# Patient Record
Sex: Male | Born: 1980 | Race: Asian | Hispanic: No | Marital: Married | Smoking: Never smoker
Health system: Southern US, Community
[De-identification: ages and names within clinical notes are randomized; demographics above are authoritative.]

## PROBLEM LIST (undated history)

## (undated) DIAGNOSIS — E785 Hyperlipidemia, unspecified: Secondary | ICD-10-CM

## (undated) DIAGNOSIS — F329 Major depressive disorder, single episode, unspecified: Secondary | ICD-10-CM

## (undated) DIAGNOSIS — F32A Depression, unspecified: Secondary | ICD-10-CM

## (undated) DIAGNOSIS — F419 Anxiety disorder, unspecified: Secondary | ICD-10-CM

## (undated) HISTORY — DX: Major depressive disorder, single episode, unspecified: F32.9

## (undated) HISTORY — DX: Hyperlipidemia, unspecified: E78.5

## (undated) HISTORY — DX: Depression, unspecified: F32.A

## (undated) HISTORY — DX: Anxiety disorder, unspecified: F41.9

---

## 2014-10-11 ENCOUNTER — Ambulatory Visit
Admission: RE | Admit: 2014-10-11 | Discharge: 2014-10-11 | Disposition: A | Discharge status: Home / Self Care | Payer: No Typology Code available for payment source | Source: Ambulatory Visit | Attending: Infectious Disease | Admitting: Infectious Disease

## 2014-10-11 ENCOUNTER — Other Ambulatory Visit: Payer: Self-pay | Admitting: Infectious Disease

## 2014-10-11 DIAGNOSIS — R7611 Nonspecific reaction to tuberculin skin test without active tuberculosis: Secondary | ICD-10-CM

## 2016-05-26 ENCOUNTER — Telehealth: Payer: Self-pay

## 2016-05-26 NOTE — Telephone Encounter (Signed)
Patient cancelled appointment wir E. Saguier for 05/27/16. States he has had death in family.

## 2016-05-27 ENCOUNTER — Ambulatory Visit: Payer: Self-pay | Admitting: Medical

## 2016-06-09 ENCOUNTER — Telehealth: Payer: Self-pay | Admitting: *Deleted

## 2016-06-09 ENCOUNTER — Encounter: Payer: Self-pay | Admitting: *Deleted

## 2016-06-09 NOTE — Telephone Encounter (Signed)
Pre-Visit Call completed with patient and chart updated.   Pre-Visit Info documented in Specialty Comments under Snap[employer]ot.    

## 2016-06-10 ENCOUNTER — Ambulatory Visit (INDEPENDENT_AMBULATORY_CARE_PROVIDER_SITE_OTHER): Payer: BLUE CROSS/BLUE SHIELD | Admitting: Medical

## 2016-06-10 ENCOUNTER — Encounter: Payer: Self-pay | Admitting: Medical

## 2016-06-10 VITALS — BP 118/72 | HR 70 | Temp 98.2°F | Ht 67.0 in | Wt 168.0 lb

## 2016-06-10 DIAGNOSIS — Z23 Encounter for immunization: Secondary | ICD-10-CM | POA: Diagnosis not present

## 2016-06-10 DIAGNOSIS — F4323 Adjustment disorder with mixed anxiety and depressed mood: Secondary | ICD-10-CM | POA: Diagnosis not present

## 2016-06-10 DIAGNOSIS — E785 Hyperlipidemia, unspecified: Secondary | ICD-10-CM | POA: Diagnosis not present

## 2016-06-10 DIAGNOSIS — F515 Nightmare disorder: Secondary | ICD-10-CM

## 2016-06-10 MED ORDER — VIIBRYD 40 MG PO TABS: 40.0000 mg | ORAL_TABLET | Freq: Every day | ORAL | Status: AC

## 2016-06-10 MED ORDER — PRAZOSIN HCL 2 MG PO CAPS: 2.0000 mg | ORAL_CAPSULE | Freq: Every day | ORAL | Status: AC

## 2016-06-10 NOTE — Progress Notes (Signed)
Subjective:    Patient ID: Eric Hickman, male    DOB: 06/20/1981, 35 y.o.   MRN: [id number]  HPI  I hArdeen Jourdainave reviewed pt PMH, PSH, FH, Social History and Surgical History  Pt was former pt [facility]. 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 Pt exercises 3-4 times a week. No caffeine beverages, non smoker, married- 2 children. High School education.   Pt has know triglycerides have been on fenofibrate. Got off of med for one month. He is exercising more and trying to diet better.   Pt is on xanax and vibryd. He thinks has more anxiety(maybe some depression per pt). He states very rare use of xanax if has panic attack. Pt has been on vibryd for about 1.5 months.  Pt is on minipress. He states was given to him to help to avoid night mares. NO hx of htn and no issues urinating.    Review of Systems  Constitutional: Negative for fever, chills and fatigue.  Respiratory: Negative for cough, chest tightness, shortness of breath and wheezing.   Cardiovascular: Negative for chest pain and palpitations.  Gastrointestinal: Negative for nausea, abdominal pain, diarrhea and blood in stool.  Musculoskeletal: Negative for back pain.  Neurological: Negative for dizziness and headaches.  Hematological: Negative for adenopathy. Does not bruise/bleed easily.  Psychiatric/Behavioral: Positive for dysphoric mood. Negative for behavioral problems and confusion. The patient is nervous/anxious.     Past Medical History  Diagnosis Date  . Hyperlipidemia   . Anxiety   . Depression      Social History   Social History  . Marital Status: Married    Spouse Name: N/A  . Number of Children: N/A  . Years of Education: N/A   Occupational History  . Not on file.   Social History Main Topics  . Smoking status: Never Smoker   . Smokeless tobacco: Never Used  . Alcohol Use: Yes     Comment: He drinks about 2 cans per week.  . Drug Use: No  . Sexual Activity: Yes   Other Topics Concern  . Not on file    Social History Narrative    History reviewed. No pertinent past surgical history.  Family History  Problem Relation Age of Onset  . Diabetes Mother   . Hypertension Mother   . Diabetes Father     No Known Allergies  Current Outpatient Prescriptions on File Prior to Visit  Medication Sig Dispense Refill  . ALPRAZOLAM PO Take by mouth.    . FENOFIBRATE PO Take by mouth.     No current facility-administered medications on file prior to visit.    BP 118/72 mmHg  Pulse 70  Temp(Src) 98.2 F (36.8 C) (Oral)  Ht 5\' 7"  (1.702 m)  Wt 168 lb (76.204 kg)  BMI 26.31 kg/m2  SpO2 97%       Objective:   Physical Exam  General Mental Status- Alert. General Appearance- Not in acute distress.   Skin General: Color- Normal Color. Moisture- Normal Moisture.  Chest and Lung Exam Auscultation: Breath Sounds:-Normal.  Cardiovascular Auscultation:Rythm- Regular. Murmurs & Other Heart Sounds:Auscultation of the heart reveals- No Murmurs.  Abdomen Inspection:-Inspeection Normal. Palpation/Percussion:Note:No mass. Palpation and Percussion of the abdomen reveal- Non Tender, Non Distended + BS, no rebound or guarding.  Neurologic Cranial Nerve exam:- CN III-XII intact(No nystagmus), symmetric smile. Drift Test:- No drift. Strength:- 5/5 equal and symmetric strength both upper and lower extremities.       Assessment & Plan:  For your anxiety and depression  I will refill your vibryd. Use xanax sparingly. I can refill xanax when you need.  For your high lipids will put in labs that you can get done tomorrow fasting.  We will give t-dap today since you are over due.  Follow up in one month or as needed

## 2016-06-10 NOTE — Addendum Note (Signed)
Addended by: Gwenevere AbbotSAGUIER, Aleksei Goodlin M on: 06/10/2016 04:14 PM   Modules accepted: Orders

## 2016-06-10 NOTE — Addendum Note (Signed)
Addended by: Gwenevere AbbotSAGUIER, Luci Bellucci M on: 06/10/2016 04:08 PM   Modules accepted: Orders

## 2016-06-10 NOTE — Addendum Note (Signed)
Addended by: Neldon LabellaMABE, Alan Riles S on: 06/10/2016 06:25 PM   Modules accepted: Orders

## 2016-06-10 NOTE — Patient Instructions (Addendum)
For your anxiety and depression I will refill your vibryd. Use xanax sparingly. I can refill xanax when you need.  For your high lipids will put in labs that you can get done tomorrow fasting.  We will give t-dap today since you are over due.  Follow up in one month or as needed

## 2016-06-10 NOTE — Progress Notes (Signed)
Pre visit review using our clinic review tool, if applicable. No additional management support is needed unless otherwise documented below in the visit note. 

## 2016-06-11 ENCOUNTER — Other Ambulatory Visit (INDEPENDENT_AMBULATORY_CARE_PROVIDER_SITE_OTHER): Payer: BLUE CROSS/BLUE SHIELD

## 2016-06-11 DIAGNOSIS — E785 Hyperlipidemia, unspecified: Secondary | ICD-10-CM

## 2016-06-11 LAB — LIPID PANEL
CHOL/HDL RATIO: 6
CHOLESTEROL: 185 mg/dL (ref 0–200)
HDL: 31.8 mg/dL — ABNORMAL LOW (ref >39.00)
Triglycerides: 470 mg/dL — ABNORMAL HIGH (ref 0.0–149.0)

## 2016-06-11 LAB — COMPREHENSIVE METABOLIC PANEL
ALBUMIN: 4.5 g/dL (ref 3.5–5.2)
ALK PHOS: 79 U/L (ref 39–117)
ALT: 41 U/L (ref 0–53)
AST: 26 U/L (ref 0–37)
BUN: 14 mg/dL (ref 6–23)
CALCIUM: 9.3 mg/dL (ref 8.4–10.5)
CO2: 30 mEq/L (ref 19–32)
Chloride: 106 mEq/L (ref 96–112)
Creatinine, Ser: 0.98 mg/dL (ref 0.40–1.50)
GFR: 93.73 mL/min (ref >60.00)
Glucose, Bld: 100 mg/dL — ABNORMAL HIGH (ref 70–99)
POTASSIUM: 3.6 meq/L (ref 3.5–5.1)
SODIUM: 141 meq/L (ref 135–145)
TOTAL PROTEIN: 7.2 g/dL (ref 6.0–8.3)
Total Bilirubin: 0.7 mg/dL (ref 0.2–1.2)

## 2016-06-11 LAB — LDL CHOLESTEROL, DIRECT: Direct LDL: 73 mg/dL

## 2016-06-13 ENCOUNTER — Telehealth: Payer: Self-pay | Admitting: Medical

## 2016-06-13 MED ORDER — FENOFIBRATE 145 MG PO TABS: 145.0000 mg | ORAL_TABLET | Freq: Every day | ORAL | Status: AC

## 2016-06-13 NOTE — Telephone Encounter (Signed)
Sent in fenofibrate to his pharmacy. Repeat lipid panel and cmp in 3 months.

## 2016-06-14 NOTE — Telephone Encounter (Signed)
Called patient left message for call back.  

## 2016-06-21 ENCOUNTER — Encounter: Payer: Self-pay | Admitting: Medical

## 2016-06-21 NOTE — Telephone Encounter (Signed)
Left message for pt to call back for any questions about his lab results.

## 2016-06-21 NOTE — Telephone Encounter (Signed)
Letter mailed to patient. Unable to reach by phone.

## 2016-06-25 ENCOUNTER — Encounter: Payer: Self-pay | Admitting: Medical

## 2016-07-08 ENCOUNTER — Ambulatory Visit: Payer: BLUE CROSS/BLUE SHIELD | Admitting: Medical

## 2016-08-10 ENCOUNTER — Encounter (HOSPITAL_BASED_OUTPATIENT_CLINIC_OR_DEPARTMENT_OTHER): Payer: Self-pay | Admitting: *Deleted

## 2016-08-10 ENCOUNTER — Ambulatory Visit (INDEPENDENT_AMBULATORY_CARE_PROVIDER_SITE_OTHER): Payer: BLUE CROSS/BLUE SHIELD | Admitting: Medical

## 2016-08-10 ENCOUNTER — Emergency Department (HOSPITAL_BASED_OUTPATIENT_CLINIC_OR_DEPARTMENT_OTHER)
Admission: EM | Admit: 2016-08-10 | Discharge: 2016-08-10 | Disposition: A | Discharge status: Home / Self Care | Payer: BLUE CROSS/BLUE SHIELD | Attending: Emergency Medicine | Admitting: Emergency Medicine

## 2016-08-10 ENCOUNTER — Emergency Department (HOSPITAL_BASED_OUTPATIENT_CLINIC_OR_DEPARTMENT_OTHER): Payer: BLUE CROSS/BLUE SHIELD

## 2016-08-10 DIAGNOSIS — M545 Low back pain, unspecified: Secondary | ICD-10-CM

## 2016-08-10 DIAGNOSIS — N201 Calculus of ureter: Secondary | ICD-10-CM | POA: Insufficient documentation

## 2016-08-10 DIAGNOSIS — N23 Unspecified renal colic: Secondary | ICD-10-CM | POA: Diagnosis not present

## 2016-08-10 DIAGNOSIS — R109 Unspecified abdominal pain: Secondary | ICD-10-CM | POA: Diagnosis present

## 2016-08-10 DIAGNOSIS — R319 Hematuria, unspecified: Secondary | ICD-10-CM

## 2016-08-10 LAB — POCT URINALYSIS DIPSTICK
Bilirubin, UA: NEGATIVE
Glucose, UA: NEGATIVE
KETONES UA: NEGATIVE
Leukocytes, UA: NEGATIVE
Nitrite, UA: NEGATIVE
PROTEIN UA: NEGATIVE
Urobilinogen, UA: 4
pH, UA: 5.5

## 2016-08-10 MED ORDER — KETOROLAC TROMETHAMINE 60 MG/2ML IM SOLN
60.0000 mg | Freq: Once | INTRAMUSCULAR | Status: AC
  Administered 2016-08-10: 60 mg via INTRAMUSCULAR

## 2016-08-10 MED ORDER — IBUPROFEN 800 MG PO TABS: 800.0000 mg | ORAL_TABLET | Freq: Three times a day (TID) | ORAL | 0 refills | Status: AC | PRN

## 2016-08-10 MED ORDER — TAMSULOSIN HCL 0.4 MG PO CAPS: 0.4000 mg | ORAL_CAPSULE | Freq: Every day | ORAL | 0 refills | Status: AC

## 2016-08-10 MED FILL — TAMSULOSIN HCL 0.4 MG CAP: 0.4 | 5 days supply | Qty: 5 | Fill #0

## 2016-08-10 MED FILL — IBUPROFEN 600 MG TABLET: 600 | 7 days supply | Qty: 21 | Fill #0

## 2016-08-10 NOTE — Patient Instructions (Signed)
With your severe back  pain, blood in urine and flank pain, it appears you may have kidney stone. Further test will be needed to see if this is the case. I have talked with ED MD downstairs. He are aware of you condition. I made him aware we gave you toradol 60 mg im. You may need further medication for pain while work up is done. Follow up with us as needed/as they determine.

## 2016-08-10 NOTE — ED Notes (Addendum)
Pt presents with L sided flank pain radiating to groin.  Patient given 60 mg IM Toradol approximately 30 minutes ago upstairs at his PCP's office.  Pain has not changed since receiving Toradol.  Denies blood in urine; PCP found microscopic blood in urine. Wife sts pain started 6 hours ago.

## 2016-08-10 NOTE — ED Provider Notes (Signed)
[facility]   CSN: [id number] Arrival date & time: 08/10/16  1127     History   Chief Complaint Chief Complaint  Patient presents with  . Flank Pain    HPI Eric Hickman is a 35 y.o. male.  The history is provided by the patient.  Flank Pain  This is a new problem. Episode frequency: intermittent. The problem has not changed since onset.Pertinent negatives include no chest pain, no abdominal pain, no headaches and no shortness of breath. Nothing aggravates the symptoms. Nothing relieves the symptoms. Treatments tried: toradol by PCP. The treatment provided mild relief.    Past Medical History:  Diagnosis Date  . Anxiety   . Depression   . Hyperlipidemia     There are no active problems to display for this patient.   History reviewed. No pertinent surgical history.     Home Medications    Prior to Admission medications   Medication Sig Start Date End Date Taking? Authorizing Provider  ALPRAZOLAM PO Take by mouth.    Historical Provider, MD  fenofibrate (TRICOR) 145 MG tablet Take 1 tablet (145 mg total) by mouth daily. 06/13/16   Ramon DredgeEdward Saguier, PA-C  ibuprofen (ADVIL,MOTRIN) 800 MG tablet Take 1 tablet (800 mg total) by mouth every 8 (eight) hours as needed. 08/10/16 08/15/16  Nira ConnPedro Eduardo Christinea Brizuela, MD  prazosin (MINIPRESS) 2 MG capsule Take 1 capsule (2 mg total) by mouth at bedtime. 06/10/16   Ramon DredgeEdward Saguier, PA-C  tamsulosin (FLOMAX) 0.4 MG CAPS capsule Take 1 capsule (0.4 mg total) by mouth daily. 08/10/16 08/15/16  Nira ConnPedro Eduardo Cristan Hout, MD  VIIBRYD 40 MG TABS Take 1 tablet (40 mg total) by mouth daily. 06/10/16   Esperanza RichtersEdward Saguier, PA-C    Family History Family History  Problem Relation Age of Onset  . Diabetes Mother   . Hypertension Mother   . Diabetes Father     Social History Social History  Substance Use Topics  . Smoking status: Never Smoker  . Smokeless tobacco: Never Used  . Alcohol use Yes     Comment: He drinks about 2  cans per week.     Allergies   Review of patient's allergies indicates no known allergies.   Review of Systems Review of Systems  Constitutional: Negative for chills and fever.  HENT: Negative for congestion and rhinorrhea.   Eyes: Negative for visual disturbance.  Respiratory: Negative for shortness of breath.   Cardiovascular: Negative for chest pain.  Gastrointestinal: Positive for vomiting. Negative for abdominal pain, diarrhea and nausea.  Genitourinary: Positive for flank pain.  Neurological: Negative for headaches.  All other systems reviewed and are negative.    Physical Exam Updated Vital Signs BP (!) 152/102 (BP Location: Left Arm)   Pulse 73   Temp 98 F (36.7 C) (Oral)   Resp 20   Ht 5\' 7"  (1.702 m)   Wt 164 lb (74.4 kg)   SpO2 100%   BMI 25.69 kg/m   Physical Exam  Constitutional: He is oriented to person, place, and time. He appears well-developed and well-nourished. No distress.  HENT:  Head: Normocephalic and atraumatic.  Nose: Nose normal.  Eyes: Conjunctivae and EOM are normal. Pupils are equal, round, and reactive to light. Right eye exhibits no discharge. Left eye exhibits no discharge. No scleral icterus.  Neck: Normal range of motion. Neck supple.  Cardiovascular: Normal rate, regular rhythm and normal heart sounds.  Exam reveals no gallop and no friction rub.   No murmur  heard. Pulmonary/Chest: Effort normal and breath sounds normal. No stridor. No respiratory distress. He has no rales.  Abdominal: Soft. He exhibits no distension. There is no tenderness.  Musculoskeletal: He exhibits no edema or tenderness.  Neurological: He is alert and oriented to person, place, and time.  Skin: Skin is warm and dry. No rash noted. He is not diaphoretic. No erythema.  Psychiatric: He has a normal mood and affect.  Vitals reviewed.    ED Treatments / Results  Labs (all labs ordered are listed, but only abnormal results are displayed) Labs Reviewed - No  data to display  EKG  EKG Interpretation None       Radiology Ct Renal Stone Study  Result Date: 08/10/2016 CLINICAL DATA:  Left flank pain and hematuria since this morning. EXAM: CT ABDOMEN AND PELVIS WITHOUT CONTRAST TECHNIQUE: Multidetector CT imaging of the abdomen and pelvis was performed following the standard protocol without IV contrast. COMPARISON:  None. FINDINGS: Lung bases: Clear.  Heart normal size. Hepatobiliary: Diffuse fatty infiltration of the liver. No liver mass or focal lesion. Normal gallbladder. No bile duct dilation. Spleen, pancreas, adrenal glands:  Normal. Kidneys, ureters, bladder: Mild left hydroureteronephrosis. This is due to a 3 mm stone that lies within the left ureterovesicular junction. No other ureteral stones. No intrarenal stones. No renal masses. No right hydronephrosis. Bladder is unremarkable. Lymph nodes:  No adenopathy. Ascites:  None. Gastrointestinal:  Normal.  Normal appendix visualized. Musculoskeletal:  Unremarkable. IMPRESSION: 1. 3 mm stone at the left ureterovesicular junction causes mild left hydroureteronephrosis. 2. No other acute findings.  No intrarenal stones. 3. Hepatic steatosis. Electronically Signed   By: Amie Portland M.D.   On: 08/10/2016 12:28    Procedures Procedures (including critical care time)  Medications Ordered in ED Medications - No data to display   Initial Impression / Assessment and Plan / ED Course  I have reviewed the triage vital signs and the nursing notes.  Pertinent labs & imaging results that were available during my care of the patient were reviewed by me and considered in my medical decision making (see chart for details).  Clinical Course    UA from PCP with positive hematuria no evidence of infection.. CT obtained given and no prior history of stones. This revealed a 3 mm left ureterolithiasis with mild obstruction. Patient's symptoms improved following IM Toradol by PCP.   Safe for discharge with  strict return precautions.   Final Clinical Impressions(s) / ED Diagnoses   Final diagnoses:  Ureterolithiasis   Disposition: Discharge  Condition: Good  I have discussed the results, Dx and Tx plan with the patient who expressed understanding and agree(s) with the plan. Discharge instructions discussed at great length. The patient was given strict return precautions who verbalized understanding of the instructions. No further questions at time of discharge.    New Prescriptions   IBUPROFEN (ADVIL,MOTRIN) 800 MG TABLET    Take 1 tablet (800 mg total) by mouth every 8 (eight) hours as needed.   TAMSULOSIN (FLOMAX) 0.4 MG CAPS CAPSULE    Take 1 capsule (0.4 mg total) by mouth daily.    Follow Up: Esperanza Richters, PA-C [address] [place] [address] [phone]  Call  As needed      Nira Conn, MD 08/10/16 1253

## 2016-08-10 NOTE — Progress Notes (Signed)
Pre visit review using our clinic tool,if applicable. No additional management support is needed unless otherwise documented below in the visit note.  

## 2016-08-10 NOTE — Progress Notes (Signed)
   Subjective:    Patient ID: Eric Hickman, male    DOB: 04/21/1981, 35 y.o.   MRN: [id number]  HPI Pt states in for severe onset left sided kidney area toward his left flank. Pt had severe pain. He can't find a comfortable position. No hx of kidney stones.  Pain started acute onset this am.   Review of Systems  Constitutional: Negative for chills, fatigue and fever.  Gastrointestinal: Negative for abdominal pain.  Genitourinary: Positive for flank pain.  Musculoskeletal: Negative for gait problem.  Neurological: Negative for dizziness.  Hematological: Negative for adenopathy. Does not bruise/bleed easily.   Past Medical History:  Diagnosis Date  . Anxiety   . Depression   . Hyperlipidemia      Social History   Social History  . Marital status: Married    Spouse name: N/A  . Number of children: N/A  . Years of education: N/A   Occupational History  . Not on file.   Social History Main Topics  . Smoking status: Never Smoker  . Smokeless tobacco: Never Used  . Alcohol use Yes     Comment: He drinks about 2 cans per week.  . Drug use: No  . Sexual activity: Yes   Other Topics Concern  . Not on file   Social History Narrative  . No narrative on file    No past surgical history on file.  Family History  Problem Relation Age of Onset  . Diabetes Mother   . Hypertension Mother   . Diabetes Father     No Known Allergies  Current Outpatient Prescriptions on File Prior to Visit  Medication Sig Dispense Refill  . ALPRAZOLAM PO Take by mouth.    . fenofibrate (TRICOR) 145 MG tablet Take 1 tablet (145 mg total) by mouth daily. 30 tablet 3  . prazosin (MINIPRESS) 2 MG capsule Take 1 capsule (2 mg total) by mouth at bedtime. 30 capsule 1  . VIIBRYD 40 MG TABS Take 1 tablet (40 mg total) by mouth daily. 30 tablet 1   No current facility-administered medications on file prior to visit.     There were no vitals taken for this visit.      Objective:   Physical  Exam  General-  Distress and extreme pain. Appears to be  trying to find comfortable position to reduce pain but can't Neck- Full range of motion, no jvd Lungs- Clear, even and unlabored. Heart- regular rate and rhythm. Abdomen- left lower quadrant area pain on palpation. No rebound or guarding.  Back- left cva tenderness.       Assessment & Plan:  With your severe back  pain, blood in urine and flank pain, it appears you may have kidney stone. Further test will be needed to see if this is the case. I have talked with ED MD downstairs. He are aware of you condition. I made him aware we gave you toradol 60 mg im. You may need further medication for pain while work up is done. Follow up with us as needed/as they determine.

## 2016-08-10 NOTE — ED Triage Notes (Signed)
Left flank pain since 5am. He was seen by his MD upstairs and diagnosed with kidney stone. He was given Toradol and urine report was brought with him here.

## 2016-08-11 LAB — URINE CULTURE: Organism ID, Bacteria: NO GROWTH

## 2016-08-24 ENCOUNTER — Encounter: Payer: Self-pay | Admitting: Medical

## 2016-08-27 ENCOUNTER — Ambulatory Visit (INDEPENDENT_AMBULATORY_CARE_PROVIDER_SITE_OTHER): Payer: BLUE CROSS/BLUE SHIELD | Admitting: Medical

## 2016-08-27 ENCOUNTER — Encounter: Payer: Self-pay | Admitting: Medical

## 2016-08-27 VITALS — BP 105/68 | HR 74 | Temp 98.3°F | Ht 67.0 in | Wt 165.0 lb

## 2016-08-27 DIAGNOSIS — E785 Hyperlipidemia, unspecified: Secondary | ICD-10-CM | POA: Diagnosis not present

## 2016-08-27 LAB — LIPID PANEL
CHOL/HDL RATIO: 6.7 ratio — AB (ref <=5.0)
Cholesterol: 200 mg/dL (ref 125–200)
HDL: 30 mg/dL — AB (ref >=40)
TRIGLYCERIDES: 444 mg/dL — AB (ref <150)

## 2016-08-27 LAB — COMPLETE METABOLIC PANEL WITH GFR
ALT: 33 U/L (ref 9–46)
AST: 25 U/L (ref 10–40)
Albumin: 4.6 g/dL (ref 3.6–5.1)
Alkaline Phosphatase: 68 U/L (ref 40–115)
BILIRUBIN TOTAL: 0.7 mg/dL (ref 0.2–1.2)
BUN: 12 mg/dL (ref 7–25)
CO2: 25 mmol/L (ref 20–31)
Calcium: 9.1 mg/dL (ref 8.6–10.3)
Chloride: 102 mmol/L (ref 98–110)
Creat: 1.19 mg/dL (ref 0.60–1.35)
GFR, EST NON AFRICAN AMERICAN: 80 mL/min (ref >=60)
GLUCOSE: 84 mg/dL (ref 65–99)
POTASSIUM: 4.4 mmol/L (ref 3.5–5.3)
SODIUM: 137 mmol/L (ref 135–146)
TOTAL PROTEIN: 6.8 g/dL (ref 6.1–8.1)

## 2016-08-27 NOTE — Patient Instructions (Signed)
For your elevated lipids will get cmp and lipid panel today.   Continue fenofribrate currently.  Diet and exercise as well.  If any recurren kidney stone type pain let us know.  Apologize for long wait today. You an call and talk with [name] or [name] if you wish.  Follow up liklely 3 months or as needed.(depedns on lab results)

## 2016-08-27 NOTE — Progress Notes (Signed)
Subjective:    Patient ID: Eric Hickman, male    DOB: 08/24/1981, 35 y.o.   MRN: [id number]  HPI   Pt in for evaluation.   Pt in for follow up. Pt in states he is for follow up for his cholesterol. Pt triglycerides were high and his ldl was low.   Pt states some exercising every day walking some in morning time. 10 minutes walks in am. Some diet modifications per pt.  Pt ate 9:30 am today. He has been fasting about 7 hours.   Pt has been on fenofibrate.  No further kidney stone type pain Sent to ED on last visit.   Review of Systems  Constitutional: Negative for chills, fatigue and fever.  Respiratory: Negative for cough, choking and chest tightness.   Cardiovascular: Negative for chest pain and palpitations.  Gastrointestinal: Negative for abdominal pain.  Genitourinary: Negative for difficulty urinating, flank pain, frequency, genital sores and hematuria.  Musculoskeletal: Negative for back pain.   Past Medical History:  Diagnosis Date  . Anxiety   . Depression   . Hyperlipidemia      Social History   Social History  . Marital status: Married    Spouse name: N/A  . Number of children: N/A  . Years of education: N/A   Occupational History  . Not on file.   Social History Main Topics  . Smoking status: Never Smoker  . Smokeless tobacco: Never Used  . Alcohol use Yes     Comment: He drinks about 2 cans per week.  . Drug use: No  . Sexual activity: Yes   Other Topics Concern  . Not on file   Social History Narrative  . No narrative on file    No past surgical history on file.  Family History  Problem Relation Age of Onset  . Diabetes Mother   . Hypertension Mother   . Diabetes Father     No Known Allergies  Current Outpatient Prescriptions on File Prior to Visit  Medication Sig Dispense Refill  . ALPRAZOLAM PO Take by mouth.    . fenofibrate (TRICOR) 145 MG tablet Take 1 tablet (145 mg total) by mouth daily. 30 tablet 3  . prazosin  (MINIPRESS) 2 MG capsule Take 1 capsule (2 mg total) by mouth at bedtime. 30 capsule 1  . VIIBRYD 40 MG TABS Take 1 tablet (40 mg total) by mouth daily. 30 tablet 1   No current facility-administered medications on file prior to visit.     BP 105/68   Pulse 74   Temp 98.3 F (36.8 C) (Other (Comment))   Ht 5\' 7"  (1.702 m)   Wt 165 lb (74.8 kg)   SpO2 98%   BMI 25.84 kg/m       Objective:   Physical Exam  General Mental Status- Alert. General Appearance- Not in acute distress.   Skin General: Color- Normal Color. Moisture- Normal Moisture.    Chest and Lung Exam Auscultation: Breath Sounds:-Normal.  Cardiovascular Auscultation:Rythm- Regular. Murmurs & Other Heart Sounds:Auscultation of the heart reveals- No Murmurs.  Abdomen Inspection:-Inspeection Normal. Palpation/Percussion:Note:No mass. Palpation and Percussion of the abdomen reveal- Non Tender, Non Distended + BS, no rebound or guarding.    Neurologic Cranial Nerve exam:- CN III-XII intact grossly intact(No nystagmus), symmetric smile.       Assessment & Plan:  For your elevated lipids will get cmp and lipid panel today.   Continue fenofribrate currently.  Diet and exercise as well.  If any recurren  kidney stone type pain let us know.  Apologize for long wait today. You an call and talk with [name] or [name] if you wish.  Follow up liklely 3 months or as needed.(depedns on lab results)  Pt wanted to know if he needs to come back to get labs  or get labs today. I told him 1 hour difference in fasting not likely to make significant difference. He can get done later or return if he wishes. He chose to do today.

## 2016-08-29 MED ORDER — ATORVASTATIN CALCIUM 10 MG PO TABS
10.0000 mg | ORAL_TABLET | Freq: Every day | ORAL | 2 refills | Status: AC
Start: 2016-08-29 — End: ?

## 2016-08-29 NOTE — Telephone Encounter (Signed)
rx lipitor sent to his pharmacy. 

## 2016-09-21 ENCOUNTER — Emergency Department (HOSPITAL_BASED_OUTPATIENT_CLINIC_OR_DEPARTMENT_OTHER)
Admission: EM | Admit: 2016-09-21 | Discharge: 2016-09-21 | Disposition: A | Discharge status: Home / Self Care | Payer: BLUE CROSS/BLUE SHIELD | Attending: Emergency Medicine | Admitting: Emergency Medicine

## 2016-09-21 ENCOUNTER — Emergency Department (HOSPITAL_BASED_OUTPATIENT_CLINIC_OR_DEPARTMENT_OTHER): Payer: BLUE CROSS/BLUE SHIELD

## 2016-09-21 ENCOUNTER — Encounter (HOSPITAL_BASED_OUTPATIENT_CLINIC_OR_DEPARTMENT_OTHER): Payer: Self-pay | Admitting: *Deleted

## 2016-09-21 DIAGNOSIS — R0602 Shortness of breath: Secondary | ICD-10-CM | POA: Insufficient documentation

## 2016-09-21 DIAGNOSIS — M542 Cervicalgia: Secondary | ICD-10-CM | POA: Diagnosis not present

## 2016-09-21 DIAGNOSIS — R071 Chest pain on breathing: Secondary | ICD-10-CM | POA: Insufficient documentation

## 2016-09-21 LAB — BASIC METABOLIC PANEL
Anion gap: 8 (ref 5–15)
BUN: 13 mg/dL (ref 6–20)
CALCIUM: 9.6 mg/dL (ref 8.9–10.3)
CHLORIDE: 106 mmol/L (ref 101–111)
CO2: 26 mmol/L (ref 22–32)
CREATININE: 1.03 mg/dL (ref 0.61–1.24)
Glucose, Bld: 103 mg/dL — ABNORMAL HIGH (ref 65–99)
Potassium: 3.6 mmol/L (ref 3.5–5.1)
SODIUM: 140 mmol/L (ref 135–145)

## 2016-09-21 LAB — CBC
HCT: 42.6 % (ref 39.0–52.0)
Hemoglobin: 14.3 g/dL (ref 13.0–17.0)
MCH: 28.9 pg (ref 26.0–34.0)
MCHC: 33.6 g/dL (ref 30.0–36.0)
MCV: 86.1 fL (ref 78.0–100.0)
PLATELETS: 189 10*3/uL (ref 150–400)
RBC: 4.95 MIL/uL (ref 4.22–5.81)
RDW: 13.5 % (ref 11.5–15.5)
WBC: 5.8 10*3/uL (ref 4.0–10.5)

## 2016-09-21 LAB — TROPONIN I: Troponin I: <0.03 ng/mL (ref <0.03)

## 2016-09-21 MED ORDER — IBUPROFEN 800 MG PO TABS
800.0000 mg | ORAL_TABLET | Freq: Once | ORAL | Status: AC
  Administered 2016-09-21: 800 mg via ORAL
  Filled 2016-09-21: qty 1

## 2016-09-21 MED ORDER — HYDROXYZINE HCL 25 MG PO TABS
25.0000 mg | ORAL_TABLET | Freq: Once | ORAL | Status: AC
  Administered 2016-09-21: 25 mg via ORAL
  Filled 2016-09-21: qty 1

## 2016-09-21 MED ORDER — IBUPROFEN 800 MG PO TABS: 800.0000 mg | ORAL_TABLET | Freq: Three times a day (TID) | ORAL | 0 refills | Status: AC

## 2016-09-21 NOTE — ED Triage Notes (Addendum)
He has chest pressure and a pinching sensation.  He has had pain in the back of his neck for a week. He went to his MD upstairs and was told to come to the ED for evaluation of pain.

## 2016-09-21 NOTE — ED Provider Notes (Signed)
[facility]   CSN: [id number] Arrival date & time: 09/21/16  1558  By signing my name below, I, Linna Darner, attest that this documentation has been prepared under the direction and in the presence of Langston Masker, [place]. Electronically Signed: Linna Darner, Scribe. 09/21/2016. 4:50 PM.  History   Chief Complaint Chief Complaint  Patient presents with  . Chest Pain    The history is provided by the patient. No language interpreter was used.     HPI Comments: Eric Hickman is a 35 y.o. male who presents to the Emergency Department complaining of sudden onset, intermittent, chest pain for several days. Pt notes chest pain in his sternum and around his right ribs. He reports associated SOB. Pt states his CP and SOB are worse with stress; he notes he has been worrying a lot about these symptoms lately. He also notes posterior neck pain and neck stiffness for the last two weeks. He states he has noticed a lot of popping and cracking sensations with twisting his neck. Pt has tried OTC ointment, ice, and heat for his neck pain/stiffness with no relief. Pt is not a smoker. He denies nausea, vomiting, or any other associated symptoms. Pt has an appointment scheduled with his PCP on 09/24/16.  Past Medical History:  Diagnosis Date  . Anxiety   . Depression   . Hyperlipidemia     There are no active problems to display for this patient.   History reviewed. No pertinent surgical history.     Home Medications    Prior to Admission medications   Medication Sig Start Date End Date Taking? Authorizing Provider  ALPRAZOLAM PO Take by mouth.    Historical Provider, MD  atorvastatin (LIPITOR) 10 MG tablet Take 1 tablet (10 mg total) by mouth daily. 08/29/16   Ramon Dredge Saguier, PA-C  fenofibrate (TRICOR) 145 MG tablet Take 1 tablet (145 mg total) by mouth daily. 06/13/16   Ramon Dredge Saguier, PA-C  prazosin (MINIPRESS) 2 MG capsule Take 1 capsule (2 mg total) by mouth at  bedtime. 06/10/16   Edward Saguier, PA-C  VIIBRYD 40 MG TABS Take 1 tablet (40 mg total) by mouth daily. 06/10/16   Esperanza Richters, PA-C    Family History Family History  Problem Relation Age of Onset  . Diabetes Mother   . Hypertension Mother   . Diabetes Father     Social History Social History  Substance Use Topics  . Smoking status: Never Smoker  . Smokeless tobacco: Never Used  . Alcohol use Yes     Comment: He drinks about 2 cans per week.     Allergies   Review of patient's allergies indicates no known allergies.   Review of Systems Review of Systems  Respiratory: Positive for shortness of breath (intermittent).   Cardiovascular: Positive for chest pain (intermittent).  Gastrointestinal: Negative for nausea and vomiting.  Musculoskeletal: Positive for neck pain and neck stiffness.  Psychiatric/Behavioral: The patient is nervous/anxious.   All other systems reviewed and are negative.   Physical Exam Updated Vital Signs BP 117/80   Pulse (!) 58   Temp 98 F (36.7 C) (Oral)   Resp 20   Ht 5\' 9"  (1.753 m)   Wt 165 lb (74.8 kg)   SpO2 98%   BMI 24.37 kg/m   Physical Exam  Constitutional: He is oriented to person, place, and time. He appears well-developed and well-nourished. No distress.  Anxious.  HENT:  Head: Normocephalic and atraumatic.  Uvula is midline.  Eyes: Conjunctivae and EOM are normal.  Neck: Neck supple. No tracheal deviation present.  Cardiovascular: Normal rate.   Pulmonary/Chest: Effort normal. No respiratory distress.  Musculoskeletal: Normal range of motion.  Neurological: He is alert and oriented to person, place, and time.  Skin: Skin is warm and dry.  Psychiatric: He has a normal mood and affect. His behavior is normal.  Nursing note and vitals reviewed.   ED Treatments / Results  Labs (all labs ordered are listed, but only abnormal results are displayed) Labs Reviewed  BASIC METABOLIC PANEL - Abnormal; Notable for the  following:       Result Value   Glucose, Bld 103 (*)    All other components within normal limits  CBC  TROPONIN I    EKG  EKG Interpretation None       Radiology Dg Chest 2 View  Result Date: 09/21/2016 CLINICAL DATA:  Shortness of breath, central chest pain for a few hours. EXAM: CHEST  2 VIEW COMPARISON:  10/11/2014 FINDINGS: The heart size and mediastinal contours are within normal limits. Both lungs are clear. The visualized skeletal structures are unremarkable. IMPRESSION: No active cardiopulmonary disease. Electronically Signed   By: Elige KoHetal  Patel   On: 09/21/2016 16:35    Procedures Procedures (including critical care time)  DIAGNOSTIC STUDIES: Oxygen Saturation is 98% on RA, normal by my interpretation.    COORDINATION OF CARE: 4:55 PM Discussed treatment plan with pt at bedside and pt agreed to plan.  Medications Ordered in ED Medications - No data to display   Initial Impression / Assessment and Plan / ED Course  I have reviewed the triage vital signs and the nursing notes.  Pertinent labs & imaging results that were available during my care of the patient were reviewed by me and considered in my medical decision making (see chart for details).  Clinical Course  Value Comment By Time  EKG 12-Lead (Reviewed) Elson AreasLeslie K Jayzen Paver, PA-C 10/10 1704  EKG 12-Lead (Reviewed) Elson AreasLeslie K Marlette Curvin, PA-C 10/10 1705      Final Clinical Impressions(s) / ED Diagnoses   Final diagnoses:  Chest pain on breathing   No outpatient prescriptions have been marked as taking for the 09/21/16 encounter [facility](Hospital Encounter).   New Prescriptions New Prescriptions   No medications on file  An After Visit Summary was printed and given to the patient.   Lonia SkinnerLeslie K VeguitaSofia, PA-C 09/21/16 [phone]    Geoffery Lyonsouglas Delo, MD 09/21/16 2134

## 2016-09-21 NOTE — Discharge Instructions (Signed)
See your Physician for recheck as scheduled. Ibuprofen for soreness.

## 2017-08-09 IMAGING — CT CT RENAL STONE PROTOCOL
2 of 4 series · 16 of 46 positions shown, 18 images · non-contrast
Comparison: None.

CLINICAL DATA: Left flank pain and hematuria since this morning.

EXAM:
CT ABDOMEN AND PELVIS WITHOUT CONTRAST
TECHNIQUE: Multidetector CT imaging of the abdomen and pelvis was performed
following the standard protocol without IV contrast.

[Series 2: axial st · axial · 0.83mm/px · z∈[-568,-58]mm · 13 of 112 slices shown, 15 images]
[im 5/112  soft-tissue]
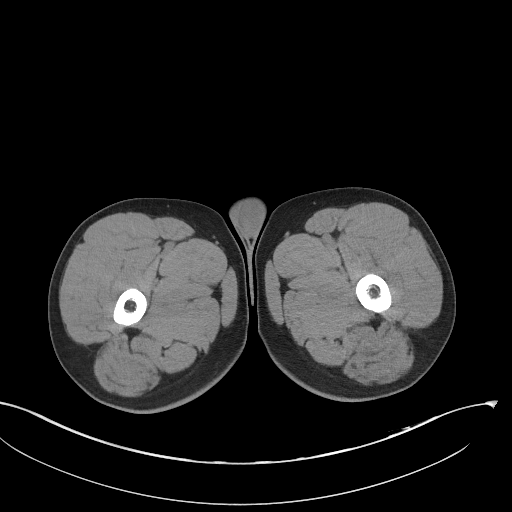
[im 5/112  bone]
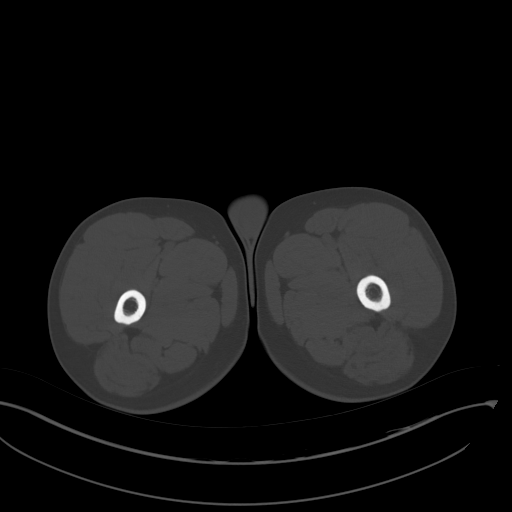
[im 15/112  soft-tissue]
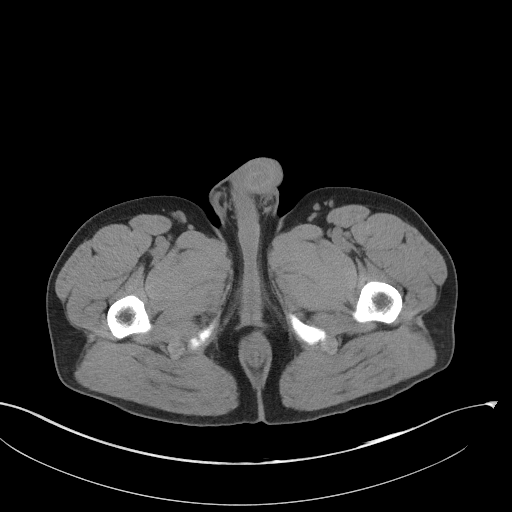
[im 25/112  soft-tissue]
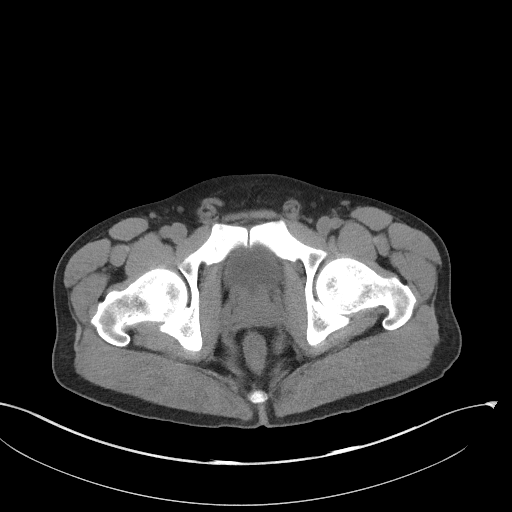
[im 29/112  soft-tissue]
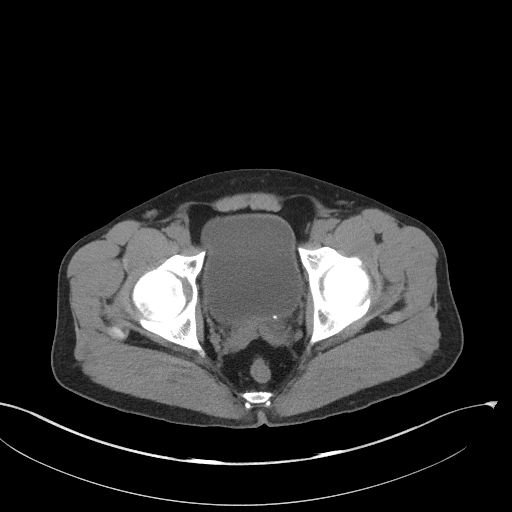
[im 39/112  soft-tissue]
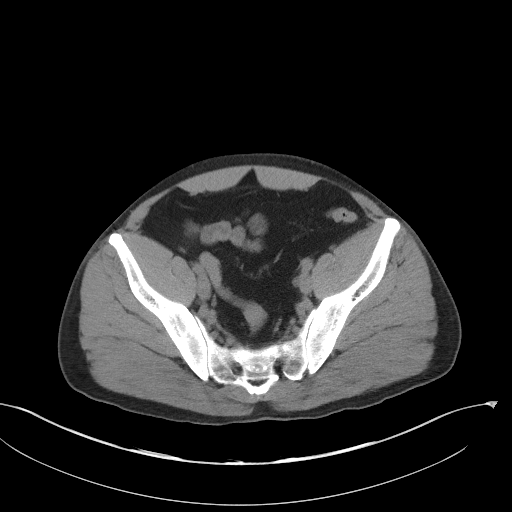
[im 49/112  soft-tissue]
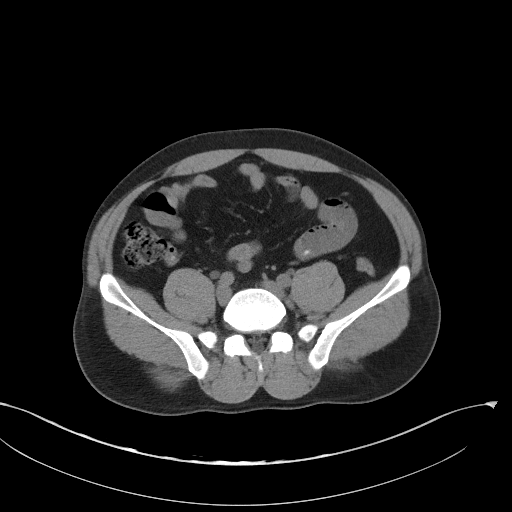
[im 58/112  soft-tissue]
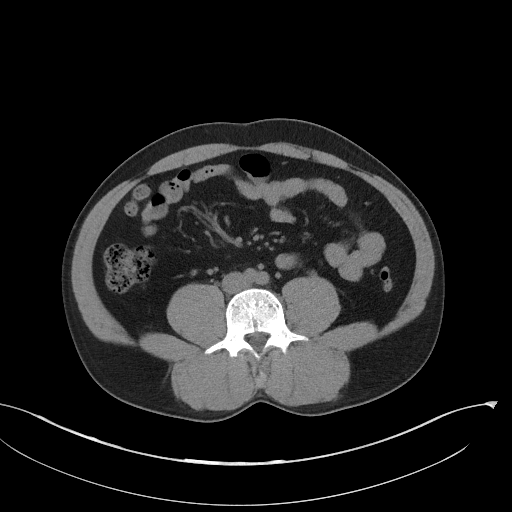
[im 63/112  soft-tissue]
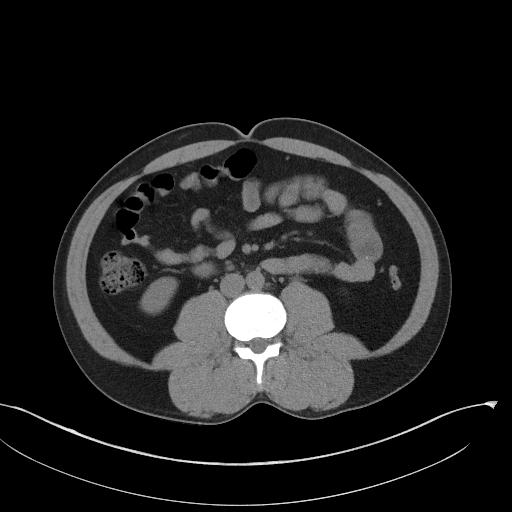
[im 73/112  soft-tissue]
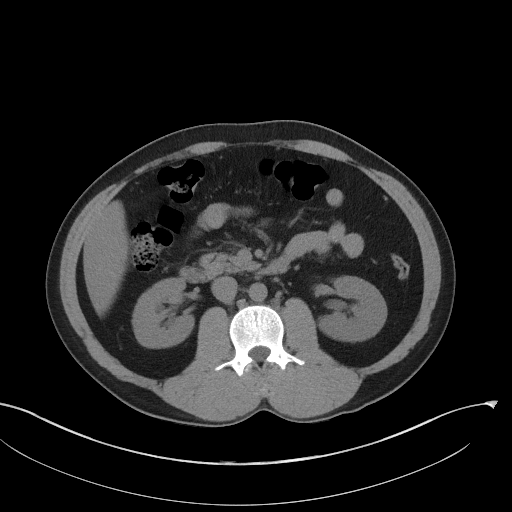
[im 73/112  bone]
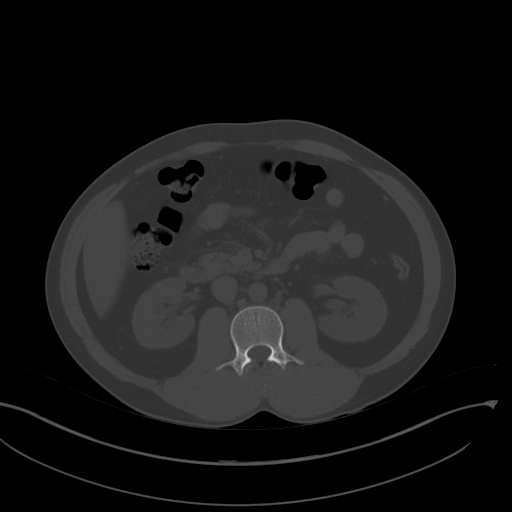
[im 83/112  soft-tissue]
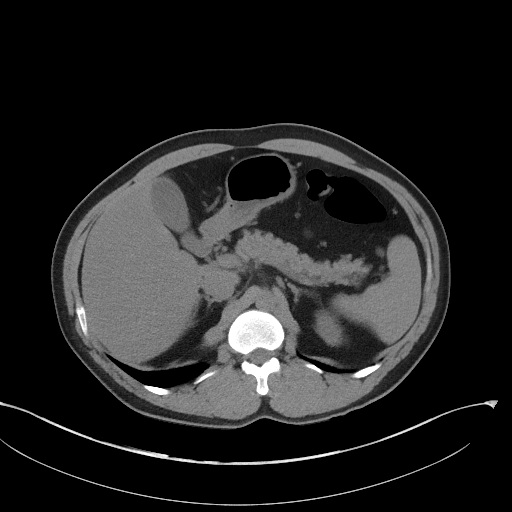
[im 87/112  soft-tissue]
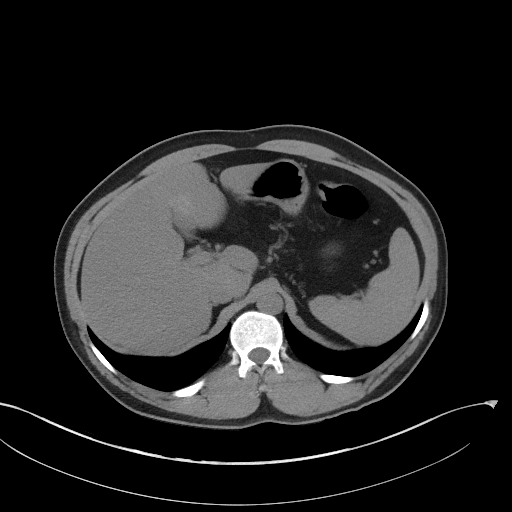
[im 97/112  soft-tissue]
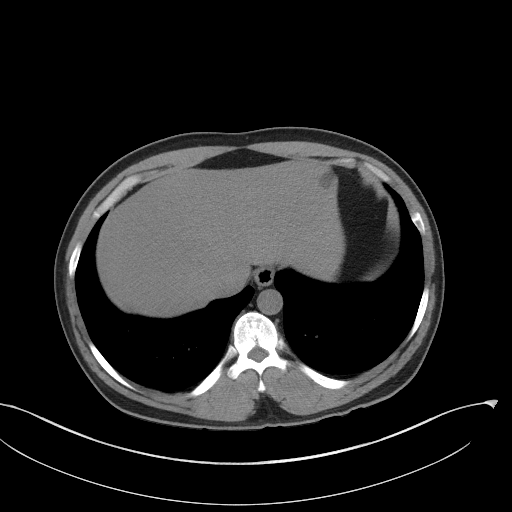
[im 107/112  soft-tissue]
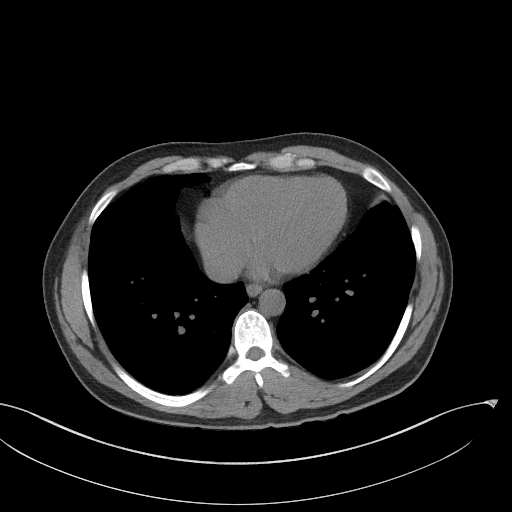

[Series 4: coronal st · coronal · 0.79mm/px · 3 of 92 slices shown]
[im 31/92  soft-tissue]
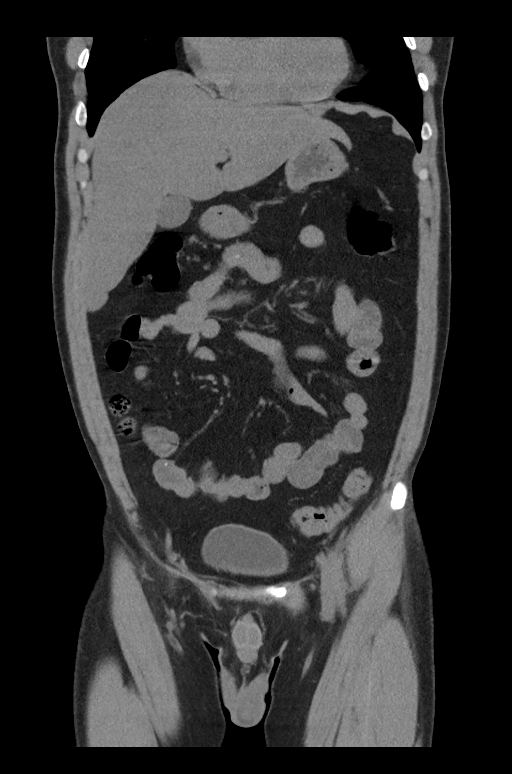
[im 41/92  soft-tissue]
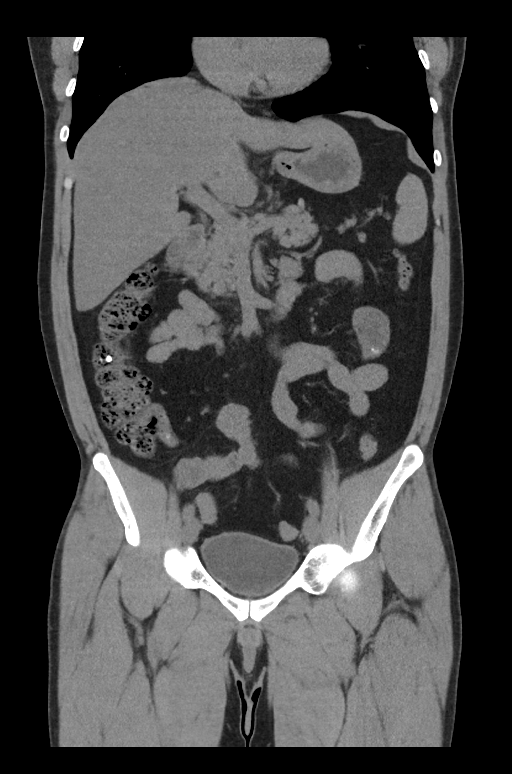
[im 51/92  soft-tissue]
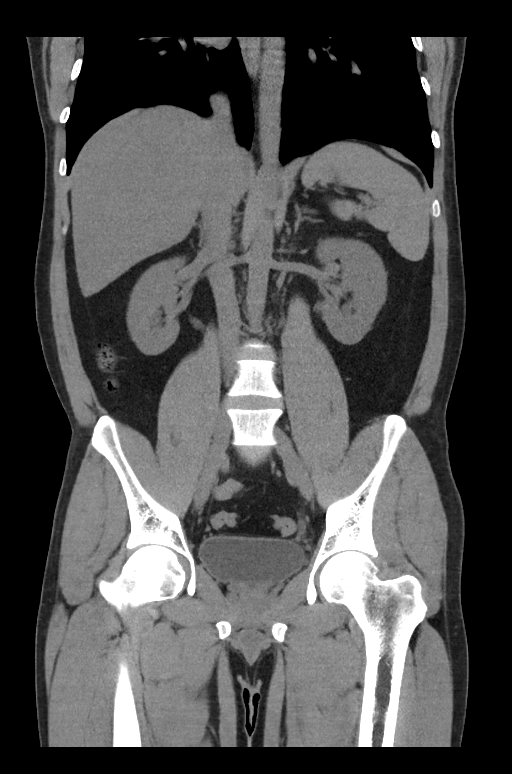

[16 of 46 positions shown; findings below may reference images not displayed]

FINDINGS: Lung bases: Clear.  Heart normal size.

Hepatobiliary: Diffuse fatty infiltration of the liver. No liver
mass or focal lesion. Normal gallbladder. No bile duct dilation.

Spleen, pancreas, adrenal glands:  Normal.

Kidneys, ureters, bladder: Mild left hydroureteronephrosis. This is
due to a 3 mm stone that lies within the left ureterovesicular
junction. No other ureteral stones. No intrarenal stones. No renal
masses. No right hydronephrosis. Bladder is unremarkable.

Lymph nodes:  No adenopathy.

Ascites:  None.

Gastrointestinal:  Normal.  Normal appendix visualized.

Musculoskeletal:  Unremarkable.
IMPRESSION: 1. 3 mm stone at the left ureterovesicular junction causes mild left
hydroureteronephrosis.
2. No other acute findings.  No intrarenal stones.
3. Hepatic steatosis.

## 2020-03-14 ENCOUNTER — Ambulatory Visit: Payer: Self-pay | Attending: Internal Medicine

## 2020-03-14 DIAGNOSIS — Z23 Encounter for immunization: Secondary | ICD-10-CM

## 2020-03-14 NOTE — Progress Notes (Signed)
   Covid-19 Vaccination Clinic  Name:  Eric Hickman    MRN: [id number] DOB: 1981-04-26  03/14/2020  [name] was observed post Covid-19 immunization for 15 minutes without incident. He was provided with Vaccine Information Sheet and instruction to access the V-Safe system.   [name] was instructed to call 911 with any severe reactions post vaccine: Marland Kitchen Difficulty breathing  . Swelling of face and throat  . A fast heartbeat  . A bad rash all over body  . Dizziness and weakness   Immunizations Administered    Name Date Dose VIS Date Route   Pfizer COVID-19 Vaccine 03/14/2020  8:43 AM 0.3 mL 11/23/2019 Intramuscular   Manufacturer: [employer], [employer]   Lot: [id number]   NDC: [address]-4

## 2020-04-07 ENCOUNTER — Ambulatory Visit: Payer: Self-pay
